# Patient Record
Sex: Male | Born: 2005
Health system: Southern US, Community
[De-identification: ages and names within clinical notes are randomized; demographics above are authoritative.]

## PROBLEM LIST (undated history)

## (undated) DIAGNOSIS — T7840XA Allergy, unspecified, initial encounter: Secondary | ICD-10-CM

## (undated) DIAGNOSIS — F909 Attention-deficit hyperactivity disorder, unspecified type: Secondary | ICD-10-CM

## (undated) HISTORY — DX: Allergy, unspecified, initial encounter: T78.40XA

## (undated) HISTORY — DX: Attention-deficit hyperactivity disorder, unspecified type: F90.9

---
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
## 2024-01-05 ENCOUNTER — Other Ambulatory Visit: Payer: Self-pay

## 2024-01-05 ENCOUNTER — Encounter: Payer: Self-pay | Admitting: Emergency Medicine

## 2024-01-05 ENCOUNTER — Ambulatory Visit: Payer: Self-pay

## 2024-01-05 ENCOUNTER — Emergency Department
Admission: EM | Admit: 2024-01-05 | Discharge: 2024-01-05 | Disposition: A | Discharge status: Home / Self Care | Attending: Emergency Medicine | Admitting: Emergency Medicine

## 2024-01-05 DIAGNOSIS — J101 Influenza due to other identified influenza virus with other respiratory manifestations: Secondary | ICD-10-CM | POA: Diagnosis not present

## 2024-01-05 DIAGNOSIS — R519 Headache, unspecified: Secondary | ICD-10-CM | POA: Diagnosis present

## 2024-01-05 LAB — RESP PANEL BY RT-PCR (RSV, FLU A&B, COVID)  RVPGX2
Influenza A by PCR: POSITIVE — AB
Influenza B by PCR: NEGATIVE
Resp Syncytial Virus by PCR: NEGATIVE
SARS Coronavirus 2 by RT PCR: NEGATIVE

## 2024-01-05 MED ORDER — GUAIFENESIN 200 MG PO TABS: 200.0000 mg | ORAL_TABLET | Freq: Four times a day (QID) | ORAL | 0 refills | Status: AC | PRN

## 2024-01-05 NOTE — ED Provider Notes (Signed)
 [facility] Provider Note    Event Date/Time   First MD Initiated Contact with Patient 01/05/24 0745     (approximate)   History   Cough   HPI  Steven [name] is a 18 y.o. male with no significant past medical history  and as listed in EMR presents to the emergency department for treatment and evaluation of headache, fever, and cough since Sunday. No nausea or vomiting.      Physical Exam   Triage Vital Signs: ED Triage Vitals  Encounter Vitals Group     BP 01/05/24 0738 139/87     Systolic BP Percentile --      Diastolic BP Percentile --      Pulse Rate 01/05/24 0738 84     Resp 01/05/24 0738 20     Temp 01/05/24 0738 99.9 F (37.7 C)     Temp Source 01/05/24 0738 Oral     SpO2 01/05/24 0738 96 %     Weight 01/05/24 0741 146 lb 9.7 oz (66.5 kg)     Height 01/05/24 0741 5\' 7" (1.702 m)     Head Circumference --      Peak Flow --      Pain Score 01/05/24 0737 0     Pain Loc --      Pain Education --      Exclude from Growth Chart --     Most recent vital signs: Vitals:   01/05/24 0738  BP: 139/87  Pulse: 84  Resp: 20  Temp: 99.9 F (37.7 C)  SpO2: 96%    General: Awake, no distress.  CV:  Good peripheral perfusion.  Resp:  Normal effort. Breath sounds are clear. Abd:  No distention.  Other:     ED Results / Procedures / Treatments   Labs (all labs ordered are listed, but only abnormal results are displayed) Labs Reviewed  RESP PANEL BY RT-PCR (RSV, FLU A&B, COVID)  RVPGX2 - Abnormal; Notable for the following components:      Result Value   Influenza A by PCR POSITIVE (*)    All other components within normal limits     EKG  Not indicated  RADIOLOGY  Image and radiology report reviewed and interpreted by me. Radiology report consistent with the same.  Not indicated.  PROCEDURES:  Critical Care performed: No  Procedures   MEDICATIONS ORDERED IN ED:  Medications - No data to display   IMPRESSION /  MDM / ASSESSMENT AND PLAN / ED COURSE   I have reviewed the triage note.  Differential diagnosis includes, but is not limited to, COVID, influenza, RSV, bronchitis, pneumonia  Patient\'s presentation is most consistent with acute illness / injury with system symptoms.  17 year old male presenting to the emergency department for treatment and evaluation of headache, fever, cough since Sunday.  See HPI for further details.  Mom is also here with similar symptoms.  Respiratory panel is positive for influenza A.  Symptomatic treatment with Tylenol, ibuprofen, and guaifenesin recommended.  Outpatient follow-up and ER return precautions discussed.      FINAL CLINICAL IMPRESSION(S) / ED DIAGNOSES   Final diagnoses:  Influenza A     Rx / DC Orders   ED Discharge Orders          Ordered    guaiFENesin 200 MG tablet  Every 6 hours PRN        04 /01/25 0845             Note:  This document was prepared using Dragon voice recognition software and may include unintentional dictation errors.   Chinita Pester, FNP 01/05/24 1521    Delton Prairie, MD 01/05/24 1540

## 2024-01-05 NOTE — ED Notes (Signed)
 See triage note  Presents with low grade temp and cough  States sx's started couple of days ago

## 2024-01-05 NOTE — Discharge Instructions (Signed)
 Continue tylenol, ibuprofen, and start mucinex.  If not improving over the week, follow up with primary care or return to the ER.

## 2024-01-05 NOTE — ED Triage Notes (Signed)
 Pt to ED via POV from home. Pt ambulatory to triage with mother. Pt reports HA, fever and cough since Sunday. Pt reports highest temp 101.8. Pt denies pain.

## 2024-05-23 ENCOUNTER — Encounter: Payer: Self-pay | Admitting: Family Medicine

## 2024-08-24 ENCOUNTER — Encounter: Payer: Self-pay | Admitting: Emergency Medicine

## 2024-08-24 ENCOUNTER — Ambulatory Visit
Admission: EM | Admit: 2024-08-24 | Discharge: 2024-08-24 | Disposition: A | Discharge status: Home / Self Care | Attending: Emergency Medicine | Admitting: Emergency Medicine

## 2024-08-24 ENCOUNTER — Ambulatory Visit (INDEPENDENT_AMBULATORY_CARE_PROVIDER_SITE_OTHER)

## 2024-08-24 DIAGNOSIS — S8991XA Unspecified injury of right lower leg, initial encounter: Secondary | ICD-10-CM

## 2024-08-24 DIAGNOSIS — M25561 Pain in right knee: Secondary | ICD-10-CM | POA: Diagnosis not present

## 2024-08-24 NOTE — ED Triage Notes (Signed)
 Pt presents with right knee pain that radiates down his right leg x 4 days. 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 Pt has not taken any medication for the pain.

## 2024-08-24 NOTE — Discharge Instructions (Addendum)
 Your knee and tib-fibx-ray were both negative.  May take Tylenol ibuprofen  as label directed for weight-based dosing.  Follow-up with [facility] in 1 week, if symptoms persist-call for appointment

## 2024-08-24 NOTE — ED Provider Notes (Signed)
 [facility]    CSN: [id number] Arrival date & time: 08/24/24  9076      History   Chief Complaint Chief Complaint  Patient presents with   Knee Pain   Leg Pain    HPI Steven [name] is a 18 y.o. male.   18 year old male pt, Steven [name], presents to urgent care for evaluation of right leg injury that occurred 4 days prior.  Patient states he was at work and was trying to get a pallet off a forklift and the pallet hit his knee and he twisted his leg trying to move out of the way of the pallet.  Patient has not take any medicine for the pain, has used heat and ice.   The history is provided by the patient and a relative. No language interpreter was used.    Past Medical History:  Diagnosis Date   ADHD (attention deficit hyperactivity disorder)    Allergy    Controlled substance agreement signed 01/22/2016   For ADHD. Scanned. in      Patient Active Problem List   Diagnosis Date Noted   Right leg injury, initial encounter 08/24/2024   Acute pain of right knee 08/24/2024   ADHD (attention deficit hyperactivity disorder) 01/22/2016   Environmental allergies 01/22/2016   Myopia of both eyes 01/14/2016    History reviewed. No pertinent surgical history.     Home Medications    Prior to Admission medications   Medication Sig Start Date End Date Taking? Authorizing Provider  [email or web address]  200 MG tablet Take 1 tablet (200 mg total) by mouth every 6 (six) hours as needed for cough or to loosen phlegm. 01/05/24   Herlinda Kirk NOVAK, FNP    Family History Family History  Problem Relation Age of Onset   Hypertension Mother    Hypertension Sister     Social History Social History   Tobacco Use   Smoking status: Never   Smokeless tobacco: Never  Vaping Use   Vaping status: Every Day  Substance Use Topics   Alcohol use: No   Drug use: No     Allergies   Patient has no known allergies.   Review of Systems Review of Systems  Constitutional:   Negative for fever.  Musculoskeletal:  Positive for arthralgias and myalgias. Negative for gait problem.  Skin: Negative.   All other systems reviewed and are negative.    Physical Exam Triage Vital Signs ED Triage Vitals  Encounter Vitals Group     BP      Girls Systolic BP Percentile      Girls Diastolic BP Percentile      Boys Systolic BP Percentile      Boys Diastolic BP Percentile      Pulse      Resp      Temp      Temp src      SpO2      Weight      Height      Head Circumference      Peak Flow      Pain Score      Pain Loc      Pain Education      Exclude from Growth Chart    No data found.  Updated Vital Signs BP (!) 146/75 (BP Location: Right Arm)   Pulse 76   Temp 98 F (36.7 C) (Oral)   Resp 16   Wt 137 lb (62.1 kg)   SpO2 98%  BMI 21.46 kg/m   Visual Acuity Right Eye Distance:   Left Eye Distance:   Bilateral Distance:    Right Eye Near:   Left Eye Near:    Bilateral Near:     Physical Exam Vitals and nursing note reviewed.  Cardiovascular:     Rate and Rhythm: Normal rate and regular rhythm.     Pulses: Normal pulses.          Dorsalis pedis pulses are 2+ on the right side.  Pulmonary:     Effort: Pulmonary effort is normal.  Musculoskeletal:     Right knee: Bony tenderness present. No erythema, ecchymosis or crepitus. Normal range of motion. Normal pulse.     Comments: Pt c/o +TTP right knee and shin, skin intact  Skin:    General: Skin is warm.     Capillary Refill: Capillary refill takes less than 2 seconds.     Comments: intact  Neurological:     General: No focal deficit present.     Mental Status: He is alert and oriented to person, place, and time.     GCS: GCS eye subscore is 4. GCS verbal subscore is 5. GCS motor subscore is 6.      UC Treatments / Results  Labs (all labs ordered are listed, but only abnormal results are displayed) Labs Reviewed - No data to display  EKG   Radiology DG Tibia/Fibula  Right Result Date: 08/24/2024 CLINICAL DATA:  Right lower leg pain 4 days after work injury. EXAM: RIGHT TIBIA AND FIBULA - 2 VIEW COMPARISON:  None Available. FINDINGS: There is no evidence of fracture or other focal bone lesions. Soft tissues are unremarkable. IMPRESSION: Negative. Electronically Signed   By: Toribio Agreste M.D.   On: 08/24/2024 11:55   DG Knee Complete 4 Views Right Result Date: 08/24/2024 CLINICAL DATA:  Right knee pain 4 days after injury at work. EXAM: RIGHT KNEE - COMPLETE 4+ VIEW COMPARISON:  09/04/2016 FINDINGS: No evidence of fracture, dislocation, or joint effusion. No evidence of arthropathy or other focal bone abnormality. Soft tissues are unremarkable. IMPRESSION: Negative. Electronically Signed   By: Toribio Agreste M.D.   On: 08/24/2024 11:54    Procedures Procedures (including critical care time)  Medications Ordered in UC Medications - No data to display  Initial Impression / Assessment and Plan / UC Course  I have reviewed the triage vital signs and the nursing notes.  Pertinent labs & imaging results that were available during my care of the patient were reviewed by me and considered in my medical decision making (see chart for details).    Discussed exam findings and plan of care with patient, x-ray was negative for fractures , rest , ice , may alternate Tylenol ibuprofen  as label directed for weight-based dosing , strict go to ER precautions given.   Patient verbalized understanding to this provider.  Ddx: Right knee pain, right leg pain, contusion, fracture Final Clinical Impressions(s) / UC Diagnoses   Final diagnoses:  Right leg injury, initial encounter  Acute pain of right knee   Discharge Instructions   None    ED Prescriptions   None    PDMP not reviewed this encounter.   Aminta Loose, NP 08/24/24 2039
